# Patient Record
Sex: Female | Born: 1935 | Race: White | Hispanic: No
Health system: Southern US, Community
[De-identification: ages and names within clinical notes are randomized; demographics above are authoritative.]

---

## 2004-03-04 ENCOUNTER — Other Ambulatory Visit: Payer: Self-pay

## 2004-11-08 ENCOUNTER — Ambulatory Visit: Payer: Self-pay | Admitting: Unknown Physician Specialty

## 2005-01-27 ENCOUNTER — Ambulatory Visit: Payer: Self-pay | Admitting: Otolaryngology

## 2005-03-29 ENCOUNTER — Ambulatory Visit: Payer: Self-pay | Admitting: Unknown Physician Specialty

## 2005-05-22 ENCOUNTER — Emergency Department: Payer: Self-pay | Admitting: Emergency Medicine

## 2005-11-09 ENCOUNTER — Ambulatory Visit: Payer: Self-pay | Admitting: Unknown Physician Specialty

## 2005-12-29 ENCOUNTER — Inpatient Hospital Stay: Payer: Self-pay | Admitting: Orthopaedic Surgery

## 2005-12-29 ENCOUNTER — Other Ambulatory Visit: Payer: Self-pay

## 2006-01-03 ENCOUNTER — Encounter: Payer: Self-pay | Admitting: Internal Medicine

## 2006-01-16 ENCOUNTER — Encounter: Payer: Self-pay | Admitting: Internal Medicine

## 2006-02-16 ENCOUNTER — Encounter: Payer: Self-pay | Admitting: Internal Medicine

## 2006-11-13 ENCOUNTER — Ambulatory Visit: Payer: Self-pay | Admitting: Unknown Physician Specialty

## 2007-02-04 ENCOUNTER — Ambulatory Visit: Payer: Self-pay | Admitting: Orthopaedic Surgery

## 2007-02-14 ENCOUNTER — Ambulatory Visit: Payer: Self-pay | Admitting: Orthopaedic Surgery

## 2007-02-14 ENCOUNTER — Other Ambulatory Visit: Payer: Self-pay

## 2007-02-21 ENCOUNTER — Inpatient Hospital Stay: Payer: Self-pay | Admitting: Orthopaedic Surgery

## 2007-12-11 ENCOUNTER — Ambulatory Visit: Payer: Self-pay | Admitting: Unknown Physician Specialty

## 2007-12-20 ENCOUNTER — Ambulatory Visit: Payer: Self-pay | Admitting: Unknown Physician Specialty

## 2009-01-19 ENCOUNTER — Ambulatory Visit: Payer: Self-pay | Admitting: Unknown Physician Specialty

## 2011-05-22 ENCOUNTER — Inpatient Hospital Stay: Payer: Self-pay | Admitting: Internal Medicine

## 2011-10-06 ENCOUNTER — Encounter: Payer: Self-pay | Admitting: Internal Medicine

## 2012-02-13 ENCOUNTER — Inpatient Hospital Stay: Payer: Self-pay | Admitting: Internal Medicine

## 2012-02-13 LAB — CBC WITH DIFFERENTIAL/PLATELET
Eosinophil #: 0 10*3/uL (ref 0.0–0.7)
HCT: 28.4 % — ABNORMAL LOW (ref 35.0–47.0)
HGB: 9.3 g/dL — ABNORMAL LOW (ref 12.0–16.0)
Lymphocyte #: 1 10*3/uL (ref 1.0–3.6)
Lymphocyte %: 12.2 %
MCH: 29.4 pg (ref 26.0–34.0)
MCHC: 32.6 g/dL (ref 32.0–36.0)
Monocyte #: 0.4 x10 3/mm (ref 0.2–0.9)
Neutrophil #: 6.6 10*3/uL — ABNORMAL HIGH (ref 1.4–6.5)
Neutrophil %: 81.8 %
Platelet: 501 10*3/uL — ABNORMAL HIGH (ref 150–440)
RDW: 13.3 % (ref 11.5–14.5)
WBC: 8.1 10*3/uL (ref 3.6–11.0)

## 2012-02-13 LAB — COMPREHENSIVE METABOLIC PANEL
Bilirubin,Total: 0.6 mg/dL (ref 0.2–1.0)
Calcium, Total: 9.1 mg/dL (ref 8.5–10.1)
Chloride: 100 mmol/L (ref 98–107)
Co2: 28 mmol/L (ref 21–32)
Creatinine: 1.71 mg/dL — ABNORMAL HIGH (ref 0.60–1.30)
Glucose: 309 mg/dL — ABNORMAL HIGH (ref 65–99)
SGPT (ALT): 29 U/L
Total Protein: 6.8 g/dL (ref 6.4–8.2)

## 2012-02-13 LAB — PROTIME-INR
INR: 1
Prothrombin Time: 13.3 secs (ref 11.5–14.7)

## 2012-02-13 LAB — URINALYSIS, COMPLETE
Glucose,UR: 150 mg/dL (ref 0–75)
Ketone: NEGATIVE
Nitrite: NEGATIVE
Ph: 5 (ref 4.5–8.0)
Protein: 100
Specific Gravity: 1.024 (ref 1.003–1.030)
Squamous Epithelial: NONE SEEN

## 2012-02-13 LAB — APTT: Activated PTT: 33.7 secs (ref 23.6–35.9)

## 2012-02-14 LAB — CBC WITH DIFFERENTIAL/PLATELET
Basophil #: 0 10*3/uL (ref 0.0–0.1)
Basophil %: 0.2 %
Eosinophil #: 0.1 10*3/uL (ref 0.0–0.7)
Eosinophil %: 1.6 %
HCT: 25.4 % — ABNORMAL LOW (ref 35.0–47.0)
Lymphocyte #: 0.9 10*3/uL — ABNORMAL LOW (ref 1.0–3.6)
Lymphocyte %: 12.1 %
MCH: 29.7 pg (ref 26.0–34.0)
MCHC: 33.5 g/dL (ref 32.0–36.0)
MCV: 89 fL (ref 80–100)
Monocyte #: 0.5 x10 3/mm (ref 0.2–0.9)
Neutrophil #: 5.7 10*3/uL (ref 1.4–6.5)
Neutrophil %: 79.4 %
Platelet: 477 10*3/uL — ABNORMAL HIGH (ref 150–440)
RDW: 12.8 % (ref 11.5–14.5)

## 2012-02-14 LAB — COMPREHENSIVE METABOLIC PANEL
Alkaline Phosphatase: 72 U/L (ref 50–136)
Anion Gap: 9 (ref 7–16)
Bilirubin,Total: 0.5 mg/dL (ref 0.2–1.0)
Co2: 26 mmol/L (ref 21–32)
EGFR (African American): 40 — ABNORMAL LOW
EGFR (Non-African Amer.): 34 — ABNORMAL LOW
Glucose: 111 mg/dL — ABNORMAL HIGH (ref 65–99)
Osmolality: 284 (ref 275–301)
Potassium: 4.1 mmol/L (ref 3.5–5.1)
SGOT(AST): 20 U/L (ref 15–37)
Total Protein: 5.7 g/dL — ABNORMAL LOW (ref 6.4–8.2)

## 2012-02-15 LAB — BASIC METABOLIC PANEL
BUN: 21 mg/dL — ABNORMAL HIGH (ref 7–18)
Calcium, Total: 7.8 mg/dL — ABNORMAL LOW (ref 8.5–10.1)
Chloride: 111 mmol/L — ABNORMAL HIGH (ref 98–107)
Co2: 23 mmol/L (ref 21–32)
EGFR (Non-African Amer.): 40 — ABNORMAL LOW
Glucose: 106 mg/dL — ABNORMAL HIGH (ref 65–99)
Osmolality: 288 (ref 275–301)
Sodium: 143 mmol/L (ref 136–145)

## 2012-02-15 LAB — CBC WITH DIFFERENTIAL/PLATELET
Eosinophil #: 0 10*3/uL (ref 0.0–0.7)
Eosinophil %: 0 %
HCT: 24 % — ABNORMAL LOW (ref 35.0–47.0)
HGB: 8 g/dL — ABNORMAL LOW (ref 12.0–16.0)
Lymphocyte #: 0.6 10*3/uL — ABNORMAL LOW (ref 1.0–3.6)
MCH: 29.6 pg (ref 26.0–34.0)
MCHC: 33.3 g/dL (ref 32.0–36.0)
MCV: 89 fL (ref 80–100)
Neutrophil #: 4.7 10*3/uL (ref 1.4–6.5)
Platelet: 470 10*3/uL — ABNORMAL HIGH (ref 150–440)
RBC: 2.7 10*6/uL — ABNORMAL LOW (ref 3.80–5.20)
RDW: 13.1 % (ref 11.5–14.5)

## 2012-02-16 LAB — CBC WITH DIFFERENTIAL/PLATELET
Basophil #: 0 10*3/uL (ref 0.0–0.1)
Basophil %: 0.1 %
Basophil %: 0.1 %
Eosinophil %: 0 %
HCT: 20.8 % — ABNORMAL LOW (ref 35.0–47.0)
Lymphocyte #: 0.8 10*3/uL — ABNORMAL LOW (ref 1.0–3.6)
MCH: 29 pg (ref 26.0–34.0)
MCHC: 32.4 g/dL (ref 32.0–36.0)
MCHC: 32.4 g/dL (ref 32.0–36.0)
MCV: 89 fL (ref 80–100)
Monocyte #: 0.8 x10 3/mm (ref 0.2–0.9)
Monocyte %: 7.9 %
Monocyte %: 6.9 %
Neutrophil #: 8 10*3/uL — ABNORMAL HIGH (ref 1.4–6.5)
Neutrophil #: 8.4 10*3/uL — ABNORMAL HIGH (ref 1.4–6.5)
Platelet: 511 10*3/uL — ABNORMAL HIGH (ref 150–440)
Platelet: 479 10*3/uL — ABNORMAL HIGH (ref 150–440)
RBC: 2.35 10*6/uL — ABNORMAL LOW (ref 3.80–5.20)
RDW: 13.4 % (ref 11.5–14.5)
WBC: 9.6 10*3/uL (ref 3.6–11.0)
WBC: 10.4 10*3/uL (ref 3.6–11.0)

## 2012-02-17 LAB — CBC WITH DIFFERENTIAL/PLATELET
Basophil #: 0 10*3/uL (ref 0.0–0.1)
Basophil %: 0.1 %
Eosinophil %: 0.1 %
HCT: 33.8 % — ABNORMAL LOW (ref 35.0–47.0)
Lymphocyte #: 1 10*3/uL (ref 1.0–3.6)
MCH: 29.5 pg (ref 26.0–34.0)
MCHC: 32.8 g/dL (ref 32.0–36.0)
MCV: 90 fL (ref 80–100)
Neutrophil #: 7.7 10*3/uL — ABNORMAL HIGH (ref 1.4–6.5)
WBC: 9.3 10*3/uL (ref 3.6–11.0)

## 2012-02-17 LAB — BASIC METABOLIC PANEL
Anion Gap: 10 (ref 7–16)
Creatinine: 1.51 mg/dL — ABNORMAL HIGH (ref 0.60–1.30)
EGFR (African American): 38 — ABNORMAL LOW
EGFR (Non-African Amer.): 33 — ABNORMAL LOW
Glucose: 141 mg/dL — ABNORMAL HIGH (ref 65–99)
Osmolality: 301 (ref 275–301)
Potassium: 3.9 mmol/L (ref 3.5–5.1)
Sodium: 147 mmol/L — ABNORMAL HIGH (ref 136–145)

## 2012-02-18 LAB — BASIC METABOLIC PANEL
Calcium, Total: 8 mg/dL — ABNORMAL LOW (ref 8.5–10.1)
Co2: 24 mmol/L (ref 21–32)
EGFR (African American): 44 — ABNORMAL LOW
EGFR (Non-African Amer.): 38 — ABNORMAL LOW
Glucose: 76 mg/dL (ref 65–99)
Osmolality: 301 (ref 275–301)
Sodium: 148 mmol/L — ABNORMAL HIGH (ref 136–145)

## 2012-02-29 ENCOUNTER — Inpatient Hospital Stay: Payer: Self-pay | Admitting: Internal Medicine

## 2012-02-29 LAB — BASIC METABOLIC PANEL
Anion Gap: 8 (ref 7–16)
BUN: 26 mg/dL — ABNORMAL HIGH (ref 7–18)
Calcium, Total: 8.6 mg/dL (ref 8.5–10.1)
Chloride: 104 mmol/L (ref 98–107)
Creatinine: 1.62 mg/dL — ABNORMAL HIGH (ref 0.60–1.30)
EGFR (African American): 35 — ABNORMAL LOW
EGFR (Non-African Amer.): 30 — ABNORMAL LOW
Glucose: 57 mg/dL — ABNORMAL LOW (ref 65–99)
Osmolality: 278 (ref 275–301)
Potassium: 4.4 mmol/L (ref 3.5–5.1)
Sodium: 138 mmol/L (ref 136–145)

## 2012-02-29 LAB — URINALYSIS, COMPLETE
Bacteria: NONE SEEN
Bilirubin,UR: NEGATIVE
Blood: NEGATIVE
Glucose,UR: NEGATIVE mg/dL (ref 0–75)
Ketone: NEGATIVE
WBC UR: 56 /HPF (ref 0–5)

## 2012-02-29 LAB — CBC
HCT: 36.2 % (ref 35.0–47.0)
HGB: 11.7 g/dL — ABNORMAL LOW (ref 12.0–16.0)
MCH: 29.5 pg (ref 26.0–34.0)
MCHC: 32.3 g/dL (ref 32.0–36.0)
MCV: 91 fL (ref 80–100)
Platelet: 382 10*3/uL (ref 150–440)
RBC: 3.96 10*6/uL (ref 3.80–5.20)

## 2012-03-01 LAB — CBC WITH DIFFERENTIAL/PLATELET
Basophil %: 0.2 %
Eosinophil #: 0.2 10*3/uL (ref 0.0–0.7)
Eosinophil %: 1.5 %
HCT: 32.4 % — ABNORMAL LOW (ref 35.0–47.0)
HGB: 10.1 g/dL — ABNORMAL LOW (ref 12.0–16.0)
MCH: 28.5 pg (ref 26.0–34.0)
MCHC: 31.3 g/dL — ABNORMAL LOW (ref 32.0–36.0)
MCV: 91 fL (ref 80–100)
Monocyte #: 0.6 x10 3/mm (ref 0.2–0.9)
Monocyte %: 4.4 %
Neutrophil #: 12.6 10*3/uL — ABNORMAL HIGH (ref 1.4–6.5)
Neutrophil %: 90.1 %
RBC: 3.56 10*6/uL — ABNORMAL LOW (ref 3.80–5.20)
RDW: 15.2 % — ABNORMAL HIGH (ref 11.5–14.5)

## 2012-03-01 LAB — BASIC METABOLIC PANEL
Anion Gap: 9 (ref 7–16)
Calcium, Total: 8 mg/dL — ABNORMAL LOW (ref 8.5–10.1)
Chloride: 105 mmol/L (ref 98–107)
Co2: 24 mmol/L (ref 21–32)
Creatinine: 1.65 mg/dL — ABNORMAL HIGH (ref 0.60–1.30)
EGFR (African American): 34 — ABNORMAL LOW
Glucose: 116 mg/dL — ABNORMAL HIGH (ref 65–99)
Osmolality: 281 (ref 275–301)

## 2012-03-02 LAB — CBC WITH DIFFERENTIAL/PLATELET
Basophil #: 0.1 10*3/uL (ref 0.0–0.1)
Eosinophil #: 0.5 10*3/uL (ref 0.0–0.7)
Eosinophil %: 6.4 %
Lymphocyte #: 1.1 10*3/uL (ref 1.0–3.6)
MCH: 29.4 pg (ref 26.0–34.0)
MCHC: 32.7 g/dL (ref 32.0–36.0)
MCV: 90 fL (ref 80–100)
Monocyte #: 0.7 x10 3/mm (ref 0.2–0.9)
Monocyte %: 9.3 %
Platelet: 299 10*3/uL (ref 150–440)
RBC: 3.47 10*6/uL — ABNORMAL LOW (ref 3.80–5.20)
RDW: 14.9 % — ABNORMAL HIGH (ref 11.5–14.5)
WBC: 7.2 10*3/uL (ref 3.6–11.0)

## 2012-03-02 LAB — BASIC METABOLIC PANEL
Anion Gap: 12 (ref 7–16)
BUN: 25 mg/dL — ABNORMAL HIGH (ref 7–18)
Calcium, Total: 7.7 mg/dL — ABNORMAL LOW (ref 8.5–10.1)
Chloride: 108 mmol/L — ABNORMAL HIGH (ref 98–107)
Co2: 18 mmol/L — ABNORMAL LOW (ref 21–32)
EGFR (Non-African Amer.): 34 — ABNORMAL LOW
Glucose: 150 mg/dL — ABNORMAL HIGH (ref 65–99)
Osmolality: 283 (ref 275–301)
Potassium: 4 mmol/L (ref 3.5–5.1)

## 2012-03-03 LAB — URINE CULTURE

## 2012-03-03 LAB — VANCOMYCIN, TROUGH: Vancomycin, Trough: 14 ug/mL (ref 10–20)

## 2012-03-04 LAB — CULTURE, BLOOD (SINGLE)

## 2012-03-06 LAB — CULTURE, BLOOD (SINGLE)

## 2012-07-19 DEATH — deceased

## 2013-07-12 IMAGING — CT CT ABD-PELV W/O CM
1 of 3 series · 12 of 32 positions shown, 18 images · non-contrast
Comparison: none

REASON FOR EXAM: (1) abd pain, please evaluate the lumbosacral area if
possible; (2) pelvic tende
COMMENTS:

PROCEDURE:     CT  - CT ABDOMEN AND PELVIS W[DATE]  [DATE]
RESULT:
TECHNIQUE: Helical noncontrasted 3 mm sections were obtained from the lung
bases through the pubic symphysis.

[Series 2: 3mm soft tissue · axial · 0.66mm/px · z∈[-631,-268]mm · 12 of 144 slices shown, 18 images]
[im 12/144  soft-tissue]
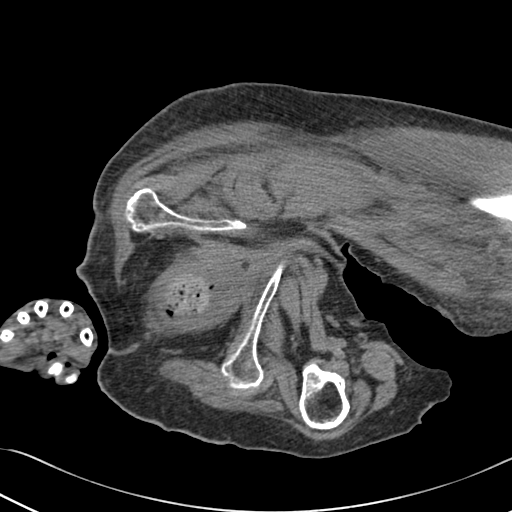
[im 12/144  bone]
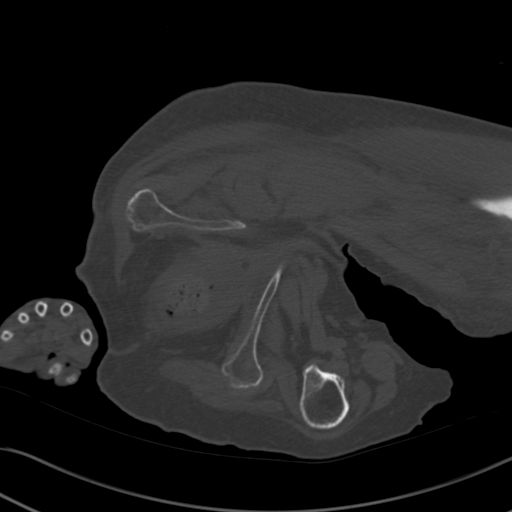
[im 23/144  soft-tissue]
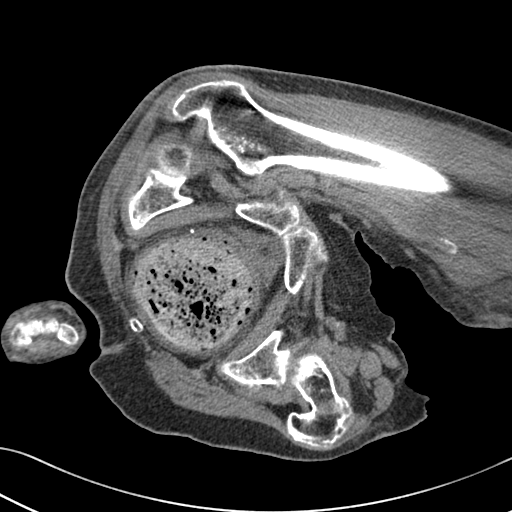
[im 34/144  soft-tissue]
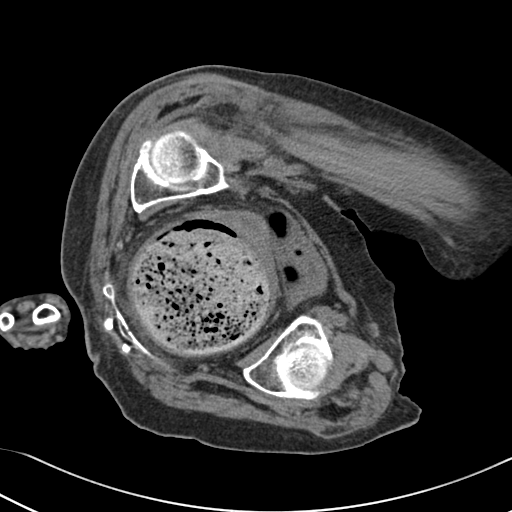
[im 45/144  soft-tissue]
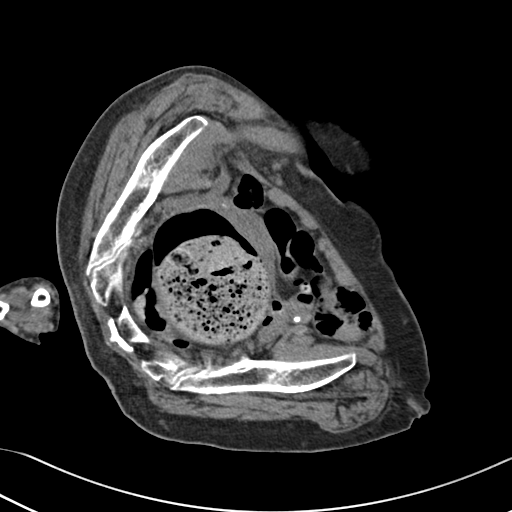
[im 56/144  soft-tissue]
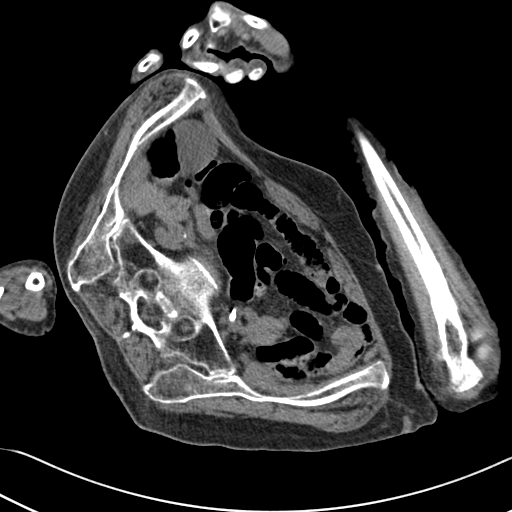
[im 67/144  soft-tissue]
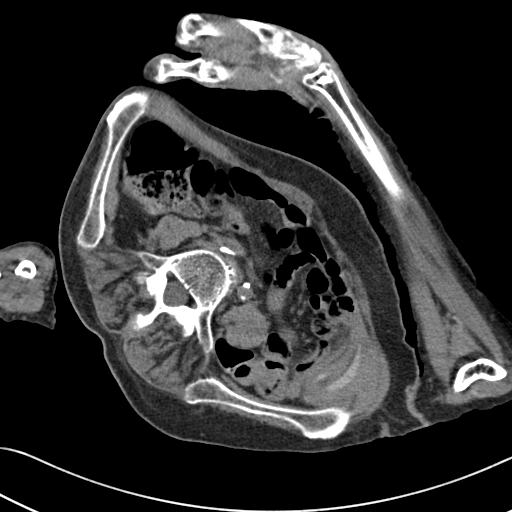
[im 78/144  soft-tissue]
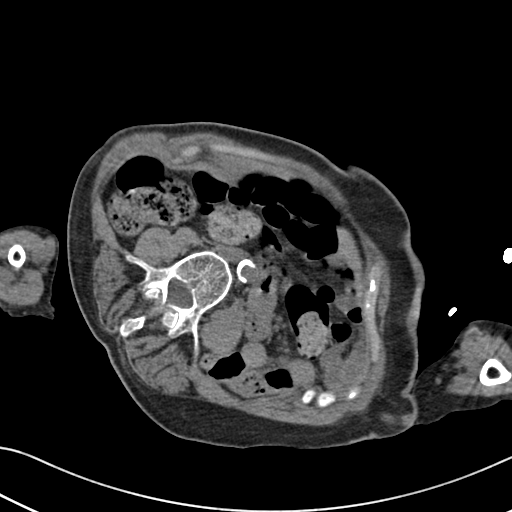
[im 89/144  soft-tissue]
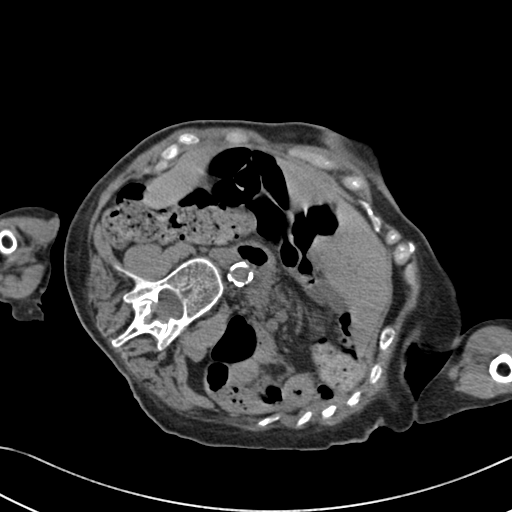
[im 100/144  soft-tissue]
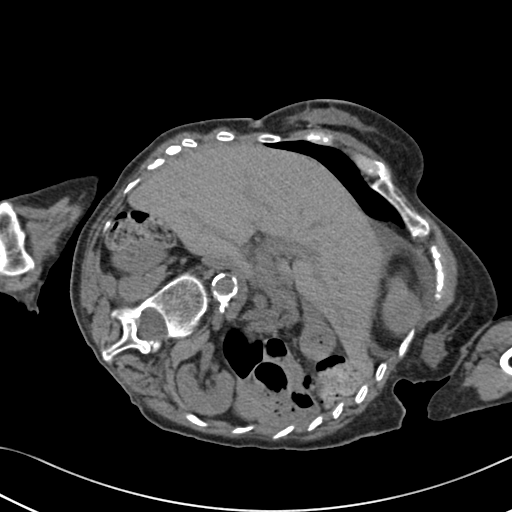
[im 100/144  lung]
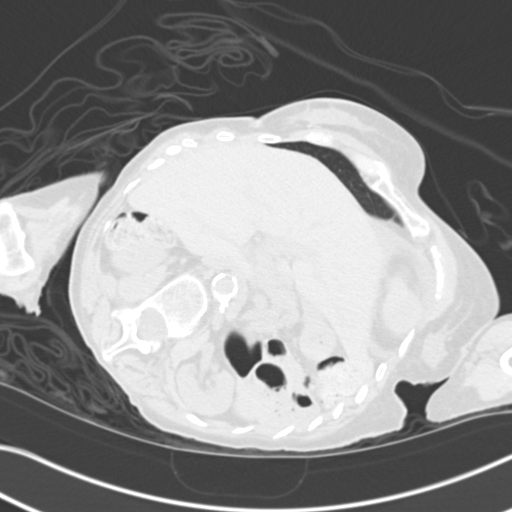
[im 100/144  bone]
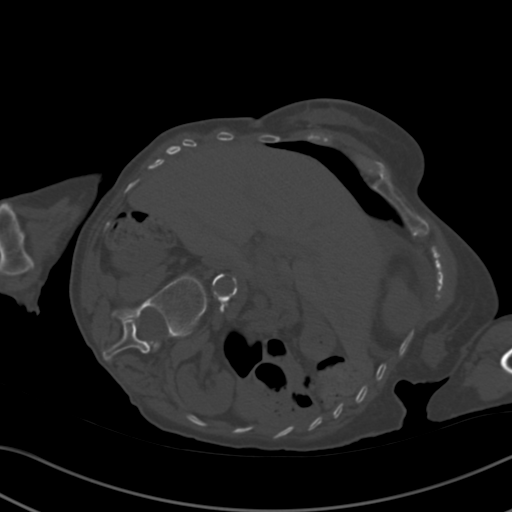
[im 111/144  soft-tissue]
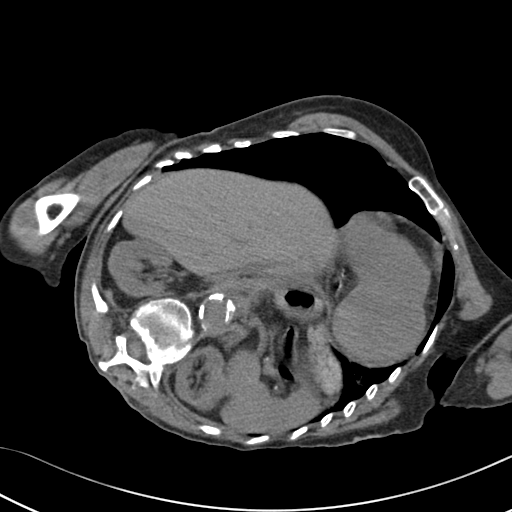
[im 111/144  lung]
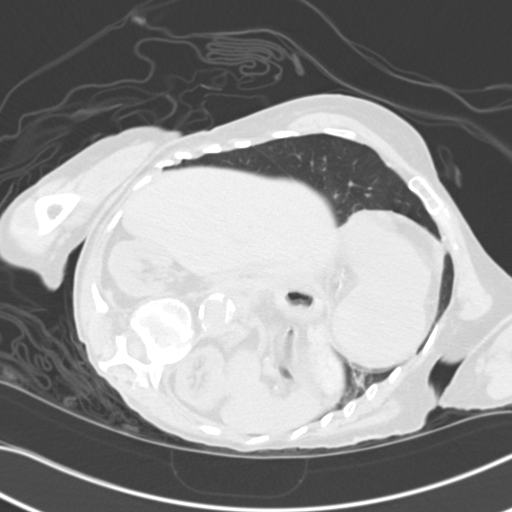
[im 122/144  soft-tissue]
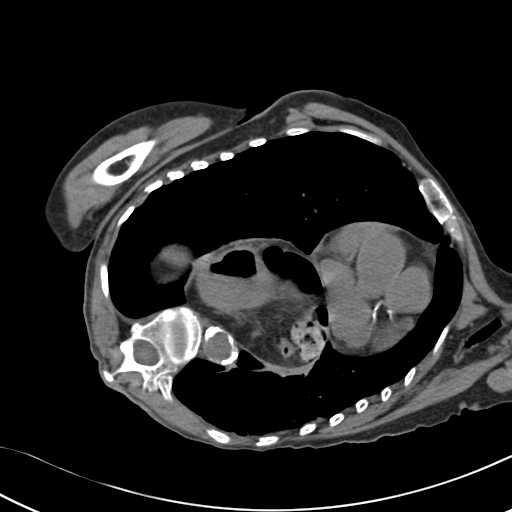
[im 122/144  lung]
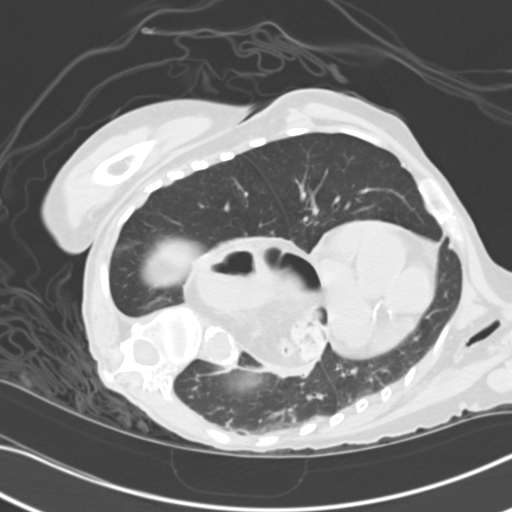
[im 133/144  soft-tissue]
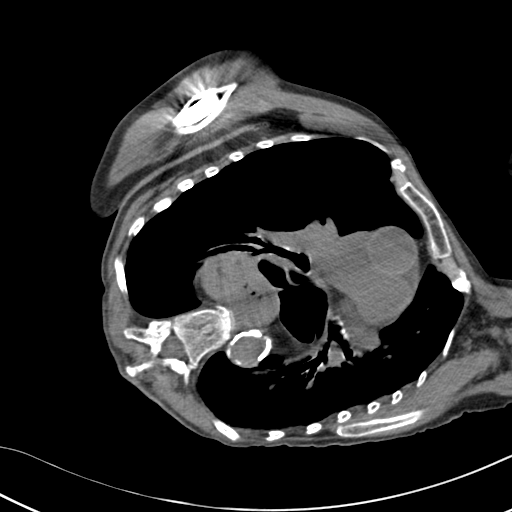
[im 133/144  lung]
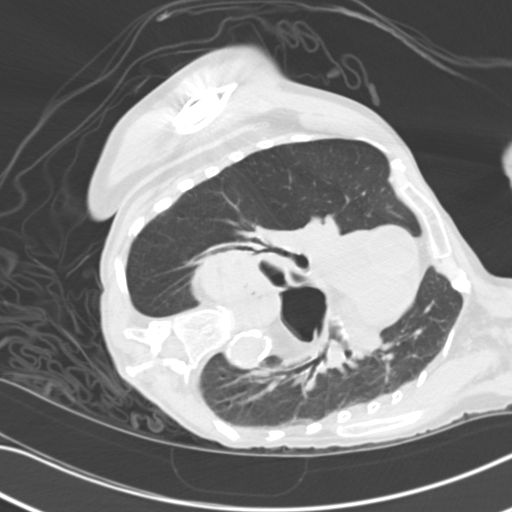

[12 of 32 positions shown; findings below may reference images not displayed]

FINDINGS: A large hiatal hernia is identified within the posterior
mediastinum. The lung parenchyma demonstrates interstitial and emphysematous
changes within the lung bases.

Noncontrasted evaluation of the liver, spleen, adrenals, pancreas, and
kidneys is unremarkable. There is no evidence of an abdominal aortic
aneurysm. There is no evidence of bowel obstruction, enteritis, colitis, nor
diverticulitis. Multiple air-filled loops of large and small bowel are
identified. A large amount of stool is identified within the rectosigmoid
colon region.

Evaluation of the osseous structures with bone windowing demonstrates no
evidence of fracture nor dislocation. There is no evidence of cortical
irregularity or destruction. The bones are osteopenic.
IMPRESSION: 1. Large amount of stool within the rectosigmoid colon region which may
represent the sequela of fecal impaction.
2. Large hiatal hernia.
3. No evidence of obstructive or inflammatory abnormalities within the
limitations of a noncontrasted CT.

## 2013-07-12 IMAGING — CR RIGHT FOOT COMPLETE - 3+ VIEW
1 series · 4 of 4 positions shown · non-contrast
Comparison: none

REASON FOR EXAM: foot painful
COMMENTS:

PROCEDURE:     DXR - DXR FOOT RT COMPLETE W/OBLIQUES  - February 13, 2012  [DATE]
RESULT:     No acute fracture or dislocation is seen. No lytic or blastic
lesions are identified. Incidental note is made of a plantar calcaneal spur.

[Series 1: x foot lat right · 0.14mm/px · 4 of 4 slices shown]
[im 1/4]
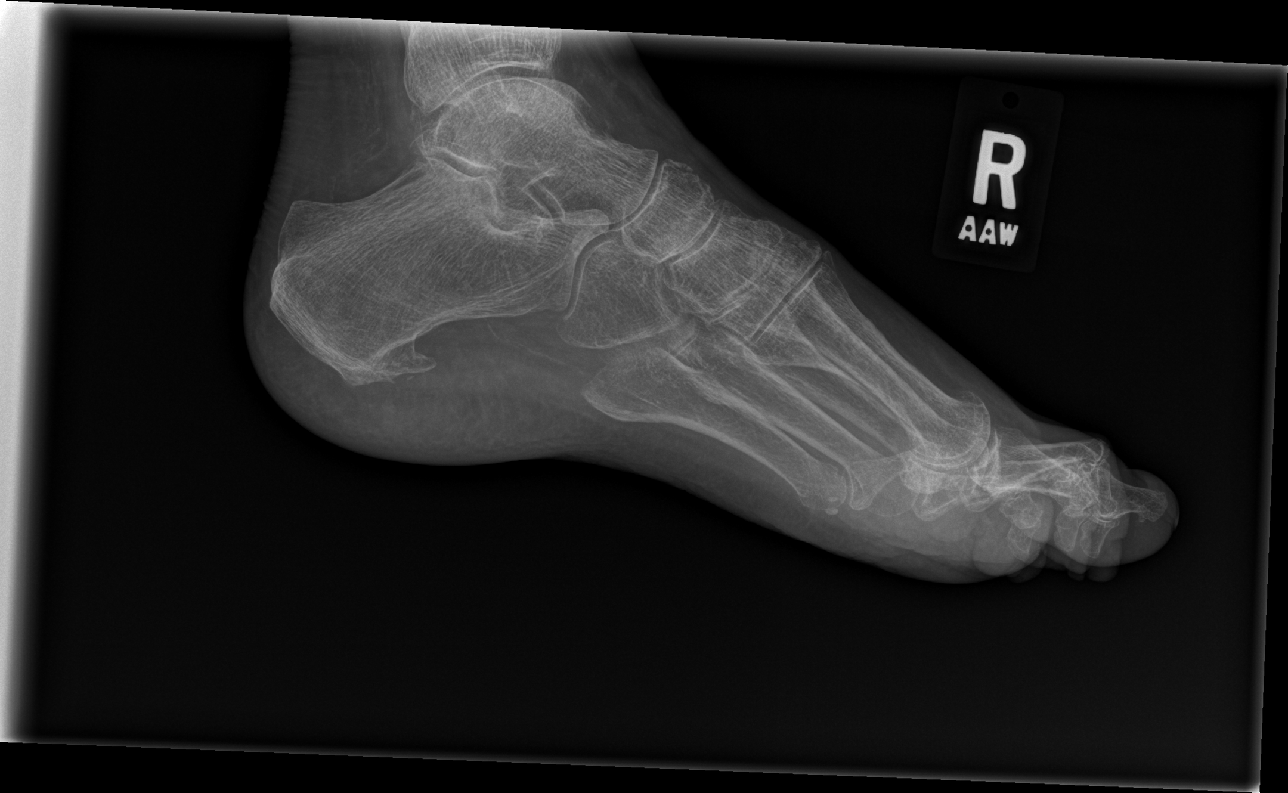
[im 2/4]
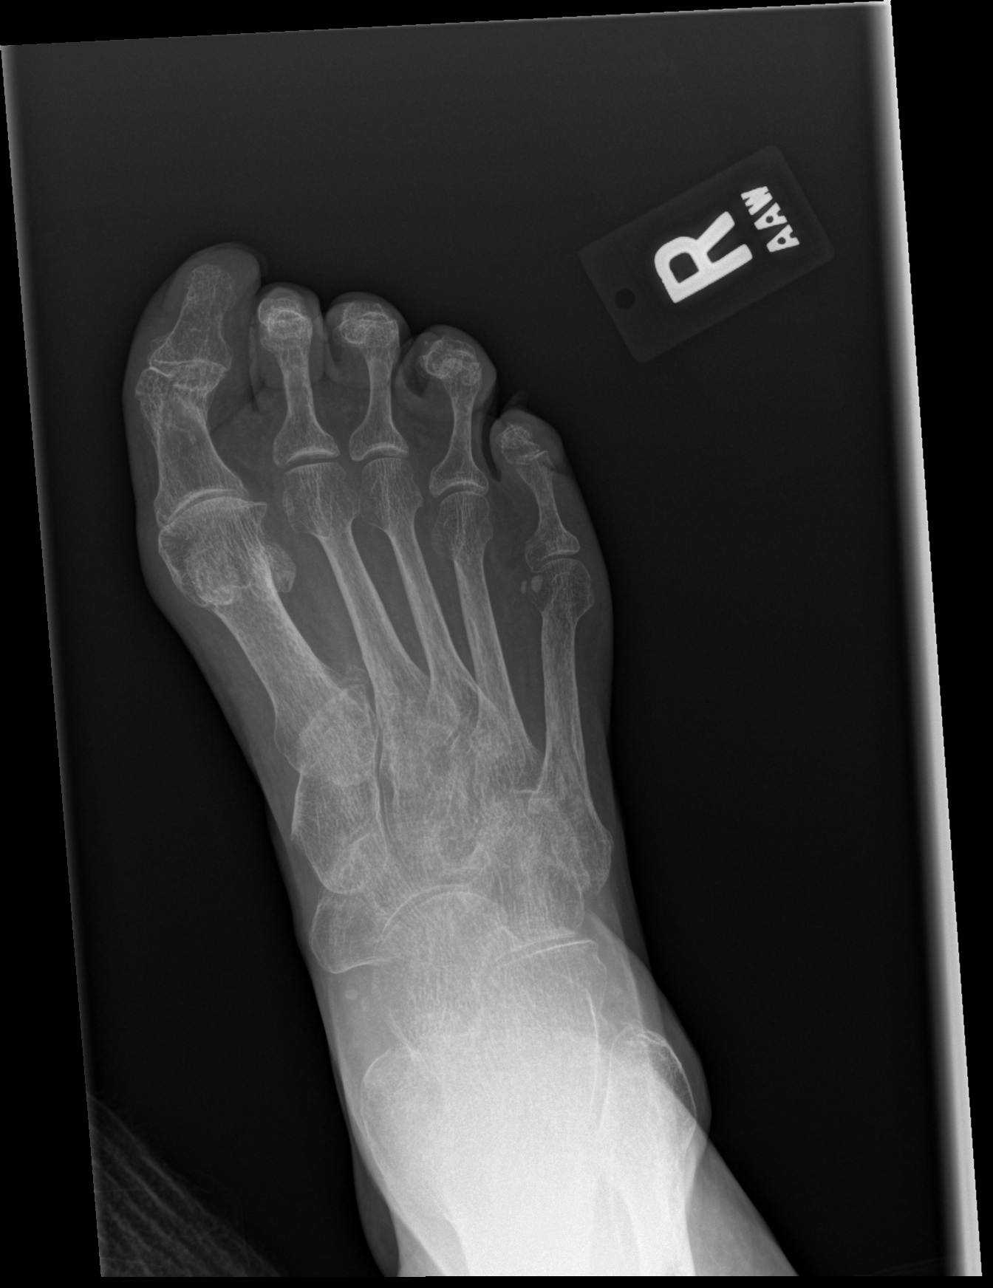
[im 3/4]
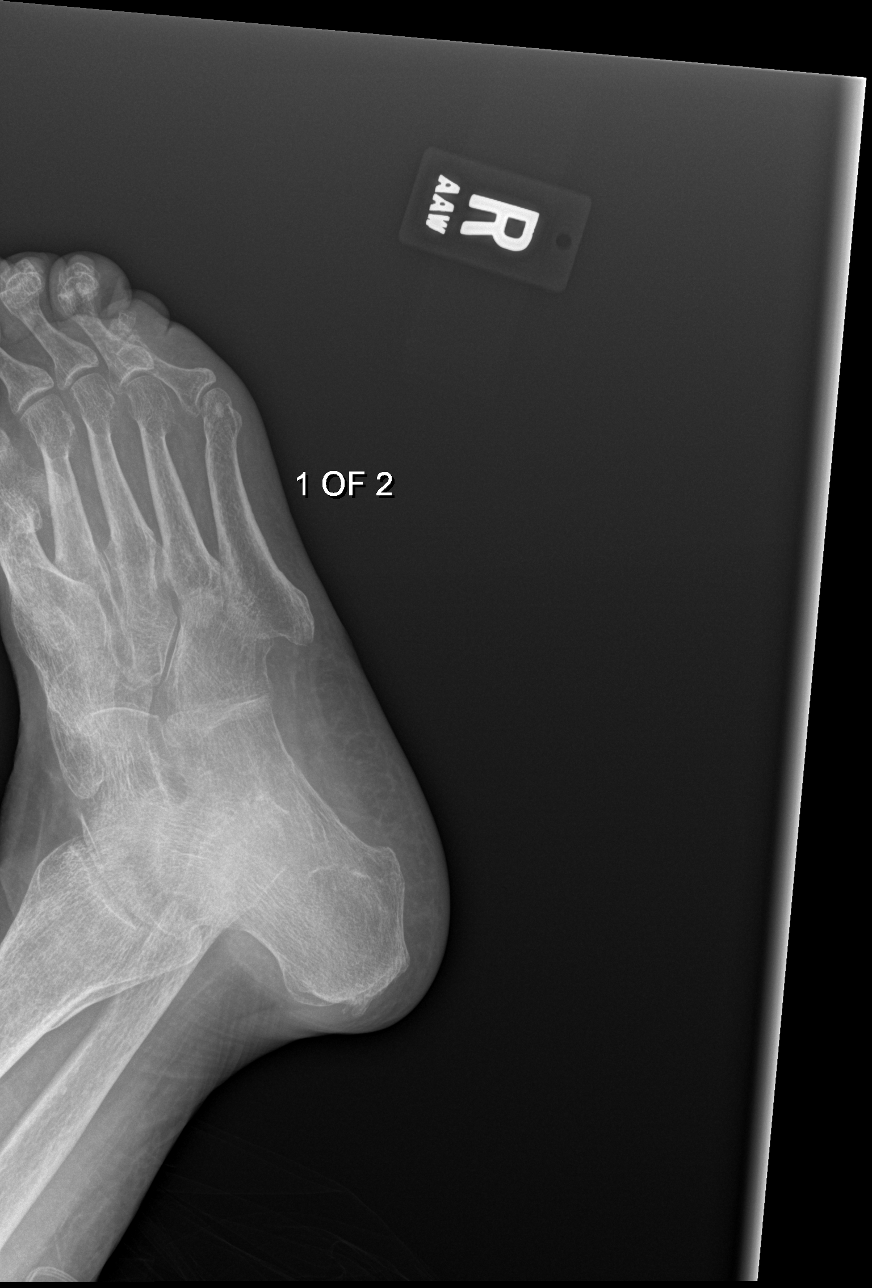
[im 4/4]
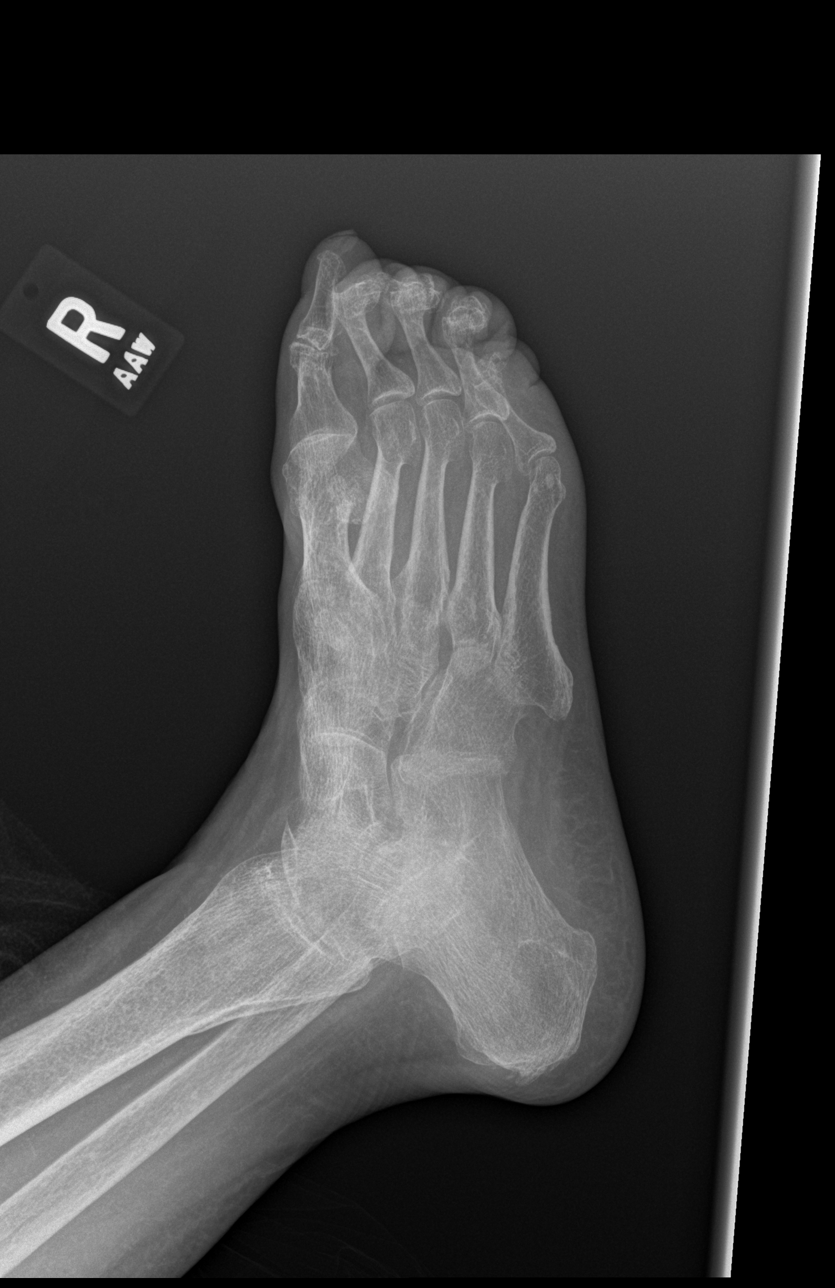

[4 of 4 positions shown; findings below may reference images not displayed]

IMPRESSION: 1.  No acute changes are identified.
2.  A plantar calcaneal spur is noted.

[REDACTED]

## 2015-01-10 NOTE — Discharge Summary (Signed)
PATIENT NAME:  Brenda Frey, Brenda Frey MR#:  [id number] DATE OF BIRTH:  21-Mar-1931  DATE OF ADMISSION:  02/29/2012 DATE OF DISCHARGE:  03/04/2012  DISCHARGE DIAGNOSES:  1. Bilateral periorbital cellulitis.  2. History of methicillin-resistant Staphylococcus aureus. 3. History of chronic urinary tract infection with indwelling Foley.  4. Severe dementia, wheelchair bound with contractures and nonverbal.  5. Chronic kidney disease, stage 2.  6. Hypertension.  7. Hyperlipidemia.  8. Enterococcus faecalis bacteremia.  9. Gastroesophageal reflux disease. 10. Hiatal hernia.   DISPOSITION: The patient is being discharged home.   FOLLOWUP:  1. Follow-up with primary care physician and Dr. Leavy CellaBlocker in 1 to 2 weeks after discharge.  2. Resume Hospice.   DIET: Low sodium, ADA diet, dysphagia I pureed with nectar-thick liquids.   DISCHARGE MEDICATIONS:  1. Lopressor 50 mg b.i.d.  2. NovoLog insulin sliding scale.  3. Amoxicillin 500 mg t.i.d. for 10 days.  4. Flexeril 10 mg every 8 hours p.r.n. for muscle spasms.  5. Omeprazole 20 mg b.i.d.  6. Loratadine 10 mg daily.  7. Simvastatin 10 mg daily.  8. Aricept 10 mg daily.  9. Lantus 8 units subcutaneously once a day.  10. Detrol LA 1 capsule once a day.  11. Hydralazine 25 mg q.i.d.  CONSULTATIONS: ID consultation with Dr. Leavy CellaBlocker.    LABORATORY, DIAGNOSTIC AND RADIOLOGICAL DATA:  Blood cultures: One out of four bottles of blood cultures grew Enterococcus faecalis which was pansensitive.  CT maxillofacial: Findings which may reflect sequela of bilateral periorbital cellulitis, right greater than left. No evidence of postseptal extension. Findings may represent sinus disease.  Urine culture grew fungus, likely colonization.  White count  normal. Hemoglobin 11.7 to 10.2. Normal platelet count. Creatinine ranging from 1.62 to 1.46. The rest of BMP normal.   HOSPITAL COURSE: The patient is a 81 year old female with a history of diabetes, hypertension,  severe dementia, who is bedbound/wheelchair bound with contractures and nonverbal, presented with facial swelling. She was admitted with a diagnosis of bilateral periorbital cellulitis. A CT scan of the maxillofacial area confirmed that. There was no extension into the septal or preseptal area. The patient had a history of MRSA and was started on IV vancomycin. An ID consultation with Dr. Leavy CellaBlocker was obtained, who also felt that the patient possibly had an allergic reaction/rash to Bactrim which the patient was taking prior to admission. The patient has a chronic Foley catheter and has recurrent UTIs. The Foley catheter was changed during the hospitalization on 03/01/2012. Urine culture showed funguria which was felt to be colonization. The patient's blood pressure was elevated during the hospitalization, and changes were made to her medications to achieve good hypertensive control. One out of four bottles of blood culture grew Enterococcus faecalis. As per Dr. Leavy CellaBlocker, the patient did not require TEE.  Since the enterococcus was pansensitive, and he recommended discharging the patient home on oral amoxicillin. The patient remained afebrile during the hospitalization with no other active issues. She is being discharged home with Hospice in a stable condition.   TIME SPENT: 45 minutes.  ____________________________ Darrick MeigsSangeeta Brendalyn Vallely, MD sp:cbb D: 03/04/2012 15:35:31 ET T: 03/04/2012 17:00:28 ET JOB#: [id number]  cc: Darrick MeigsSangeeta Gradie Butrick, MD, <Dictator> Darrick MeigsSANGEETA Trinidad Petron MD ELECTRONICALLY SIGNED 03/05/2012 16:24

## 2015-01-10 NOTE — Consult Note (Signed)
PATIENT NAME:  Brenda, Frey MR#:  [id number] DATE OF BIRTH:  01/22/1931  DATE OF CONSULTATION:  02/13/2012  REFERRING PHYSICIAN:  Prime Doc  CONSULTING PHYSICIAN:  Annice Needy, MD  REASON FOR CONSULTATION: Evaluate for right lower extremity ischemia.   HISTORY OF PRESENT ILLNESS: This is a 81 year old white female who has advanced dementia, is nonverbal and in hospice who was admitted to the hospital. She has severe flexion contracture of the right leg as well as the left leg, but apparently it was felt that her right foot was painful to her. We are asked to evaluate this for arterial insufficiency. She can provide none of the history and this is obtained from the previous medical record and examination is limited due to her overall frail condition, extensive flexion contractures, and current somewhat irritated state.   PAST MEDICAL HISTORY:  1. Advanced dementia, on hospice care.  2. Hypertension.  3. Hyperlipidemia.  4. Diabetes.   ALLERGIES: No known drug allergies.   SOCIAL HISTORY: She is in hospice services due to her end-stage dementia, living at home.  FAMILY HISTORY: Positive for hypertension.  HOME MEDICATIONS:  (Per the History and Physical) 1. Actos 45 mg daily.  2. Aricept 10 mg at night.  3. Aspirin 81 mg daily.  4. Avapro 300 mg daily.  5. Celebrex 200 mg daily.  6. Citrucel daily.  7. Detrol LA 4 mg daily. 8. Glipizide 10 mg daily.  9. Metformin 1000 mg daily.  10. Multivitamin daily.  11. Namenda 10 mg twice a day.  12. Omeprazole 20 mg daily.  13. Simvastatin 10 mg at bedtime.  14. Vitamin B12 monthly.  15. Zyrtec 10 mg daily.  REVIEW OF SYSTEMS: Unobtainable due to her dementia.   PHYSICAL EXAMINATION:   GENERAL: This is a frail, elderly white female with severe flexion contractures.   VITAL SIGNS: Temperature 98.3, heart rate 60, blood pressure 150/67, and saturation 98% on room air.   EYES: Pupils are equal, round and reactive to light. Extraocular  movements are intact.  HEAD: Normocephalic, atraumatic.   NECK: Supple without adenopathy or jugular venous distention.   HEART: Regular. No murmurs, rubs, or gallops.   LUNGS: Clear bilaterally without increased respiratory effort.   ABDOMEN: Soft, nondistended, and nontender.   EXTREMITIES: She has a flexion contracture, worse on the right than the left. She is unable to extend it beyond 90 degrees. Her left leg does not have the severe contracture. On examination her right foot is warm. There is minimal discoloration laterally. She has good capillary refill and no evidence clinically of ischemia. Her pedal pulses are not easily palpable, but are Dopplerable. Likely this more of a chronic issue as they are similar in both feet.   NEURO: She is nonverbal, unable to assess strength or tone.   SKIN: No ulcerations detected.  LABORATORY DATA: White blood cell count 8.1, hemoglobin 9.3, and platelet count 501,000. Sodium 136, potassium 4.6, chloride 100, CO2 28, BUN 31, creatinine 1.71, and glucose 309.   ASSESSMENT AND PLAN: This is a 80 year old white female with what was reported as right foot pain. It is very difficult to reproduce this on exam and I am not sure how they have decided this foot is painful to her. She does not have any evidence of acute ischemia or limb threat. She likely has some chronic peripheral vascular disease. It is really not an issue in a patient with end-stage dementia and on hospice care. No acute intervention would be  warranted and I would not start her on anticoagulants for this. I have no other recommendations.   This is a level-4 consultation.  ____________________________ Annice NeedyJason S. Damarcus Reggio, MD jsd:slb D: 02/28/2012 12:52:33 ET T: 02/28/2012 14:32:47 ET JOB#: [id number]  cc: Annice NeedyJason S. Lemya Greenwell, MD, <Dictator> Annice NeedyJASON S Lindalee Huizinga MD ELECTRONICALLY SIGNED 03/07/2012 9:04

## 2015-01-10 NOTE — Consult Note (Signed)
Impression: 81yo WF w/ h/o dementia, DM, CRI, chronic foley catheterization, recent Methacillin Resistant Staph aureus in the urine admitted with probable sulfa allergic reaction.  She started TMP/SMX a few days prior to the facial swelling and erythema.  Around this time she began having early morning hypoglycemia.  These symptoms have improved since admission and stopping of the TMP/SMX.  Would consider her allergic to sulfa. She was having fevers prior to starting the TMP/SMX.  She was admitted recently for leg swelling.  At that time, a urine culture grew Methacillin Resistant Staph aureus.  While Staph aureus can cause UTIs in catheterized patients, it is also associated with hematogenous spread to the kidneys.  No blood cultures were obtained during her last hospitalization.  She could have endocarditis with Staph aureus.  Will await the current blood cultures.  Would not pursue echo unless her BCx are positive for Methacillin Resistant Staph aureus.  On the other hand, many patients with chronic foleys have chronic colonization.  The presence of pyuria and a positive culture in a demented patient may not indicate any infection at all.  It may be that the Staph in the urine represents chronic colonization. Will continue the vanco for now.   Follow her fever curve and WBC. Maintain contact isolation.  Electronic Signatures: Jarmel Linhardt, Rosalyn GessMichael E (MD)  (Signed on 14-Jun-13 15:37)  Authored  Last Updated: 14-Jun-13 15:37 by Shalunda Lindh, Rosalyn GessMichael E (MD)

## 2015-01-10 NOTE — Consult Note (Signed)
Asked to see patient regarding potential ischemic/embolic phenomenon to the RLE.  She has a severe flexion contracture of that leg.  She has advanced dementia and is non-verbal and in hospice, but this is apparently painful fo her.  The foot is warm, there is minimal discoloration laterally.  She has good capillary refill and no evidence clinically of ischemia.  Suspect some sort of musculoskeletal or neurologic issue is more problematic, and acute arterial insufficiency does not appear to be present.  Would not start anti-coagulants.  No other recs.  Will sign off, please call with questions.    Electronic Signatures: Annice Needyew, Jason S (MD)  (Signed on 28-May-13 20:33)  Authored  Last Updated: 28-May-13 20:33 by Annice Needyew, Jason S (MD)

## 2015-01-10 NOTE — Discharge Summary (Signed)
PATIENT NAME:  Brenda Frey, Brenda Frey MR#:  [id number] DATE OF BIRTH:  Aug 28, 1931  DATE OF ADMISSION:  02/13/2012 DATE OF DISCHARGE:  02/19/2012  DISCHARGE DIAGNOSES: 1. Right leg contracture likely secondary to underlying dementia.  2. Staphylococcal urinary tract infection. 3. Hypertension. 4. Diabetes mellitus type 2.  5. Chronic kidney disease stage II. 6. Dementia. 7. Gastroesophageal reflux disease. 8. Hiatal hernia.  9. Acute on chronic anemia.   DISCHARGE MEDICATIONS: 1. Simvastatin 10 mg p.o. daily. 2. Aricept 10 mg p.o. daily. 3. Aspirin 81 mg daily. 4. Claritin 10 mg daily. 5. Ferrous fumarate with iron 28 mg daily.  6. Oxybutynin 5 mg every six hours as needed.  7. Detrol LA 4 mg once a day.  8. Prilosec 20 mg p.o. daily.  9. Flexeril 10 mg p.o. t.i.d. p.r.n. for muscle spasms.  10. Hydralazine 25 mg p.o. q.i.d.  11. Lantus 8 units at bedtime.  12. Zyvox, this has actually been changed to doxycycline as Zyvox is expensive and not covered by insurance and I called pharmacy to dispense doxycycline 100 days p.o. b.i.d. for 10 days.  13. Prednisone 20 mg daily for two days, 10 mg daily for two days then stop.  14. Metoprolol 25 mg p.o. b.i.d.    NOTE: Patient was on metformin before for diabetes but that is stopped because of her renal insufficiency. He also has history of B12 deficiency, anemia. Gets B12 shots every month. . Patient started on Lantus for diabetes.   DIET: Low sodium, ADA diet, puree with thickened liquids and nectar consistency. Medications in puree. Use assistance with feeding. Please see the speech therapy recommendation for full details.   CONSULTATIONS: 1. Vascular surgery consultation.  2. Podiatry consultation.   HOSPITAL COURSE: This is a 81 year old female with history of hypertension, diabetes mellitus type 2, chronic kidney disease and severe dementia nonverbal came in because of right leg contracture. Look at the history and physical by Dr. Delfino Lovett  for full detail. According to family the right leg contracture was relatively sudden onset with swelling around the right ankle and pain around the right knee. Patient followed by hospice at home. She is admitted for right leg contracture. She had lower extremity ultrasound of the right leg which did not show deep venous thrombosis. Seen by Dr. Wyn Quaker from vascular, recommended no further intervention as she has  pedal pulse, good capillary refill and no vascular insufficiency was there and he signed off on the case. Patient had  other work up  including hip x-ray, pelvic x-ray, ankle x-ray and knee x-ray of right leg. Patient's right foot x-ray showed osteopenia and  plantar calcaneal spur is noted. No lytic lesions. Incidental note of plantar calcaneal spur. Ultrasound of the right leg showed no blood clot. Lumbar spine x-ray showed severe compression deformity at T12, age indeterminate, likely chronic osteopenia. Patient has xray of ls spine without evidence of acute osseous abnormality. Right femur x-ray showed no acute abnormality. No dislocation or fracture or malalignment. Pelvic x-ray also showed no evidence of acute osseous abnormality within visualized portion of pelvis. Right leg contracture is most likely  due to her demenia which may be exacerbated possibly from urinary tract infection with the pain in that area causing the contracture. Patient has been started on Flexeril and there has been improvement in the contracture. Patient did have x-ray of the knee as well which showed only small amount of retropatellar effusion but no other issues. Patient to resume home hospice and home PT.  Urinary tract infection. Patient's urine culture showed leukocyte esterase 3+, nitrates negative and bacteria 1+. Urine culture showed Staphylococcus more than 100,000 colonies. She was on Unasyn initially and changed to Zyvox but discharged home on doxycycline for 10 days. Advised the son and hospice nurse to recheck the  urine culture and change Foley catheter every two weeks. Patient does have chronic indwelling Foley at home.   Acute on chronic renal insufficiency. Baseline creatinine is around 1.24 but GFR is low at 44. Patient's has creatinine of 1.71 and BUN 31 on 05/28. CT abdomen was done because of abdominal pain. It showed large amount of stool, fecal impaction, large hiatal hernia. She was started on stool softeners. Patient's BUN and creatinine on 06/02 showed BUN 36 and creatinine 1.32, almost back to baseline but I did stop metformin and Avapro and started her on Lantus for diabetes and sugars have been around 111. Patient is on Lantus. Her sugars were elevated because she was started on trial dose of prednisone for her leg pain. Her Lantus has been adjusted. She also got a prescription for Lantus.   Acute on chronic anemia. Baseline hemoglobin around 8 and she actually dropped to 6.8 during the hospital course. Stool occult test was negative. Patient's hemoglobin was 11.1 on 06/01 and on admission patient's hemoglobin was 9.6. The patient does have a history of B12 deficiency, anemia and iron deficiency  anemia. Patient did receive  prbc one< unit  transfusion and her hemoglobin is actually up to 11.1 and stayed stable,family refused further GI work up. Patient did not have any evidence of bleeding. Hemoglobin was 6.8 as I mentioned and she did receive 2 units of transfusion. Patient's B12 levels were also checked.called SOn next day of discharge with b12 results  Possible lung mass. Patient had x-ray of the chest on 05/31 for possible congestive heart failure. Patient has density in both lung bases concerning for atelectasis. Chest x-ray showed possible right lower lobe mass and  mild  pulmonary vascular congestion but no edema is identified so CT of the chest with contrast was ordered but because of her poor creatinine clearance and low GFR patient did not have CT chest with contrast, however, she had a chest  CT without contrast which showed large hiatal hernia, severe kyphosis and curvature of the spine and extremity contractures. Patient has , large hiatal hernia extending from midline to right and likely reflecting mass of concern which is unchanged from May 28. Patient has interstitial markings, increased density in the lung bases. Differential considerations are atelectasis. Patient has no hypoxia, has no fever and maybe small atelectasis.Hypertension. Patient's blood pressure was elevated to 190/78. Started her on Norvasc initially because of pedal edema. That is discontinued and gave  prescription for hydralazine and metoprolol.  . Blood pressure needs to be checked by hospice nurse. Patient needs repeat urine culture and also change the Foley in two weeks. 45 degrees after eating to help with hiatal hernia. Patient does have large hiatal hernia by CT chest but has no symptoms.   Dementia. Patient is followed by hospice at home.  Ingrown toenails. Patient has long ingrown toenails on the right leg. Podiatry, Dr. Alberteen Spindleline, has seen the patient and debrided all the nails.   Dementia and dysphagia. She is on dysphagia diet. Please refer to <speech therapy note Discharge diagnoses and medications, hospital course updated   to son and daughter at bedside.   TIME SPENT ON DISCHARGE PREPARATION: More than 30 minutes.  CODE STATUS: DO NOT RESUSCITATE.  ____________________________ Katha Hamming, MD sk:cms D: 02/19/2012 22:15:52 ET T: 02/20/2012 11:15:00 ET JOB#: [id number]  cc: Katha Hamming, MD, <Dictator> Katha Hamming MD ELECTRONICALLY SIGNED 02/26/2012 14:08

## 2015-01-10 NOTE — H&P (Signed)
PATIENT NAME:  Brenda Frey, Brenda Frey MR#:  [id number] DATE OF BIRTH:  July 11, 1931  DATE OF ADMISSION:  02/13/2012  ER REFERRING PHYSICIAN: Dorothea GlassmanPaul Malinda, MD  PRIMARY CARE PHYSICIAN: Silver HugueninAileen Miller[facility]  CHIEF COMPLAINT: Unable to move her right extremity.  HISTORY OF PRESENT ILLNESS: The patient is a 81 year old female with a known history of advanced dementia, hypertension, is followed by Hospice at home, was brought into the Emergency Department as she was unable to move her right lower extremity. It has been contracted since Saturday. She usually sleeps on that side. Her family member denies any fall or injury on that side. Hospice nurse evaluated the patient and felt that she might have had blood clot in her leg as her right ankle was cyanosed and very cold She was brought into the Emergency Department and was still having real difficulty moving her right extremity as it was very contracted. Her urinalysis showed possible urinary tract infection, and clinically she seemed to be dry. She is being admitted for further evaluation and management.   PAST MEDICAL HISTORY: The patient is nonverbal, and most of the history was obtained from the records, ED physician, and the family members.  1. Hypertension. 2. Diabetes.  3. Hyperlipidemia. 4. End-stage dementia, on Hospice care.   ALLERGIES: No known drug allergies.   REVIEW OF SYSTEMS: Review of systems is unobtainable secondary to severe end-stage dementia.   FAMILY HISTORY: Positive for hypertension.   SOCIAL HISTORY: She has end-stage dementia, followed by Hospice. She lives at home.  MEDICATIONS AT HOME: As per the medical record done by ER Nursing: 1. Actos 45 mg p.o. daily.  2. Aricept 10 mg p.o. at bedtime.  3. Aspirin 81 mg p.o. daily. 4. Avapro 300 mg p.o. daily. 5. Celebrex 200 mg p.o. daily as needed.  6. Citracal once daily.  7. Detrol LA 4 mg p.o. daily.  8. Glipizide 10 mg p.o. daily.  9. Metformin 1000 mg p.o. daily.   10. Multivitamin 1 tablet p.o. daily.  11. Namenda 10 mg p.o. b.i.d.  12. Omeprazole 20 mg p.o. daily.  13. Simvastatin 10 mg p.o. at bedtime. 14. Vitamin B12 once intramuscular monthly.  15. Zyrtec 10 mg p.o. daily.   PHYSICAL EXAMINATION:  VITAL SIGNS: Temperature 98.3, heart rate 60 per minute, respirations 20 per minute, blood pressure 150/67 mmHg. She is saturating 98% on room air.   GENERAL: The patient is a 81 year old female lying in the bed. She is nonverbal and lying comfortably without any acute distress.   HEENT: Eyes: Pupils are equal, round, reactive to light and accommodation. No scleral icterus. Extraocular muscles are intact. HENT: Head atraumatic, normocephalic. Oropharynx and nasopharynx are clear.   NECK: Supple. No jugular venous distention. No thyroid enlargement or tenderness.   LUNGS: Clear to auscultation bilaterally. No wheezing, rales, rhonchi, or crepitation.   CARDIOVASCULAR: S1, S2 normal. No murmurs, rubs, or gallop.   ABDOMEN: Soft, nontender, nondistended. Bowel sounds are present. No organomegaly or mass.   EXTREMITIES: She has a contracture on her right lower extremity. She is unable to extend it past 90 degrees. The left lower extremity is fine. She has an indwelling Foley catheter with bag.   NEUROLOGIC: She is nonverbal, makes eye contacts and has facial grimaces. One leg movement. Oral neurological examination was difficult.   PSYCHIATRIC: Unable to evaluate due to her end-stage dementia, nonverbal status.   LABORATORY, DIAGNOSTIC AND RADIOLOGICAL DATA:  Normal BMP except BUN of 31, creatinine 1.71, blood glucose 309.0.  Normal liver function tests.  CBC within normal limits except hemoglobin 9.3, hematocrit 28.4, platelets 501.0.  Normal coagulation panel.  Urinalysis showed 2004 WBCs, 3+ leukocyte esterase, WBC in clumps present.  Right foot x-ray showed no acute changes.   IMPRESSION AND PLAN:  1. Possible urinary tract infection based  on urinalysis: We will get urine culture and sensitivity. Start her on Rocephin empirically. This could be due to chronic indwelling Foley.  2. Possible embolism in the right lower extremity: Based on ED evaluation, although I doubt it based on my evaluation. We will hold off anticoagulation until Vascular Surgery evaluates the patient. We will obtain lower extremity Dopplers to rule out deep vein thrombosis. This is likely from chronic contracture in her lower extremity based on my clinical evaluation.  3. Right lower extremity contracture: We will obtain x-ray of the hip, femur and back to evaluate for any pathology there contributing to her contracture, start her on Flexeril at this time. Looking back at her previous admission in September 2012, she did have multiple contractures also, but family indicates that this is something new since Saturday. We will get further evaluation as above.  4. Acute on chronic kidney disease, stage II, with a baseline creatinine of 1.2: We will hydrate her and monitor her kidney function. This is likely prerenal.  5. Diabetes: We will hold her oral medications, start her on insulin sliding scale.   CODE STATUS: DO NOT RESUSCITATE. She is followed by Hospice at home. We will get Palliative Care to see her.   TIME TAKEN: Total time taking care of this patient is 55 minutes.   ____________________________ Ellamae Sia. Sherryll Burger, MD vss:cbb D: 02/13/2012 18:03:16 ET T: 02/14/2012 09:36:29 ET JOB#: [id number] cc: Yetta Flock, MD Annice Needy, MD Ellamae Sia [facility] MD ELECTRONICALLY SIGNED 02/15/2012 15:16

## 2015-01-10 NOTE — H&P (Signed)
PATIENT NAME:  Brenda Frey, Brenda Frey MR#:  [id number] DATE OF BIRTH:  1931-05-14  DATE OF ADMISSION:  02/29/2012  PRIMARY CARE PHYSICIAN: Dr. Francia Greaves  REFERRING PHYSICIAN: Dr. Carollee Massed  CHIEF COMPLAINT: Low blood sugar for the past two days and also facial swelling today.   HISTORY OF PRESENT ILLNESS: Patient is a 81 year old Caucasian female with history of dementia, leg contracture, staph urinary tract infection, hypertension, diabetes, chronic kidney disease presented to ED with above chief complaint. Patient is demented, nonverbal. According to her son patient is in hospice care at home. She was just discharged to home one week ago from the hospital for leg contracture and anemia. Patient has been on Lantus 8 units at bedtime, but developed low blood sugar at 30s for the past two days. In addition, he notice that the patient had facial swelling early in the morning especially on the left periorbital area but just now patient also had right periorbital swelling. In addition, patient has fever, 102. Patient had a urinary tract infection and follow up with physician was being treated with antibiotics as outpatient.   PAST MEDICAL HISTORY:  1. Hypertension. 2. Diabetes. 3. Hyperlipidemia. 4. End-stage dementia on hospice care.  5. Chronic kidney disease stage II. 6. Dementia.  7. Gastroesophageal reflux disease. 8. Hiatal hernia.  9. Anemia.  10. Staph urinary tract infection.  11. Leg contracture.   FAMILY HISTORY: Hypertension.   SOCIAL HISTORY: Lives at home. No smoking or drinking or illicit drugs. End-stage dementia in hospice care at home.   REVIEW OF SYSTEMS: Unable to obtain due to dementia status.   ALLERGIES: No.  HOME MEDICATIONS: 1. Cyclobenzaprine 10 mg p.o. every eight hours p.r.n.  2. Detrol LA 4 mg p.o. once daily. 3. Donepezil 10 mg p.o. daily.  4. Doxycycline 100 mg p.o. b.i.d.  5. Fluconazole 100 mg p.o. daily. 6. Hydralazine 25 mg p.o. q.i.d.  7. Lantus 8 units  sub-Q at bedtime.  8. Loratadine 10 mg p.o. daily. 9. Lopressor 25 mg p.o. b.i.d.  10. Omeprazole 20 mg p.o. b.i.d.  11. Zocor 10 mg p.o. at bedtime. 12. ES 1 tablet b.i.d.   PHYSICAL EXAMINATION:  VITAL SIGNS: Temperature 97.7, blood pressure 155/73, pulse 85, oxygen saturation 94% on room air, respirations 18.   GENERAL: Patient is demented in no acute distress.   HEENT: Patient has periorbital swelling, cannot open her eye but patient is demented. Unable to examine at this time. There is some erythema on the face skin.   NECK: Supple. No JVD or carotid bruit. No lymphadenopathy. No thyromegaly.   CARDIOVASCULAR: S1, S2 regular rate, rhythm. No murmurs, gallops.   PULMONARY: Bilateral air entry. No wheezing, rales.   ABDOMEN: Soft. No distention. Bowel sounds present. It is difficult to estimate whether patient has organomegaly due to patient's extremity contracture status.   EXTREMITIES: Bilateral leg contracted, mild edema. No clubbing or cyanosis.   SKIN: No rash or jaundice.   NEUROLOGY: Patient is highly demented and nonverbal, unable to examine at this time.   LABORATORY, DIAGNOSTIC AND RADIOLOGICAL DATA: Glucose 77. CAT scan of maxillofacial area without contrast showed bilateral periorbital cellulitis right greater than left. No evidence of postseptal extension of these findings. The air cell structures appear intact. Urinalysis showed WBC 56, RBC 2, nitrite negative. WBC 10.3, hemoglobin 11.7, platelets 382, glucose 57, BUN 26, creatinine 1.62. Electrolytes normal   IMPRESSION:  1. Periorbital cellulitis.  2. Hypoglycemia.  3. Diabetes, uncontrolled.  4. Urinary tract infection.  5. Acute  renal failure, dehydration and chronic kidney disease.  6. Anemia, stable.  7. Hypertension.  8. Dementia.  9. Leg contracture.   PLAN OF TREATMENT:  1. Patient will be admitted to medical floor. Will start vancomycin and Rocephin. Will get ID consult. Patient has a history of  MRSA. Will get contact isolation.   2. For hypoglycemia will hold the Lantus and start sliding scale and hypoglycemia protocol.  3. For mild acute renal failure and dehydration on chronic kidney disease we will start IV fluid and follow BMP.  4. Continue hypertension medication.  5. Patient is DO NOT RESUSCITATE status.   Discussed patient's situation and plan of treatment with patient's son.   TIME SPENT: About 60 minutes.   ____________________________ Shaune PollackQing Laelynn Blizzard, MD qc:cms D: 02/29/2012 19:16:09 ET T: 02/29/2012 23:23:06 ET  JOB#: [id number] cc: Shaune PollackQing Al Gagen, MD, <Dictator> Cheryl L. Lin GivensJeffries, MD Shaune PollackQING Vasil Juhasz MD ELECTRONICALLY SIGNED 03/03/2012 14:35

## 2015-01-10 NOTE — Consult Note (Signed)
PATIENT NAME:  Brenda Frey, Brenda Frey MR#:  [id number] DATE OF BIRTH:  1930-12-31  DATE OF CONSULTATION:  03/01/2012  REFERRING PHYSICIAN:  Dr. Imogene Burn  CONSULTING PHYSICIAN:  Rosalyn Gess. Vijay Durflinger, MD  REASON FOR CONSULTATION: Fever, rash, and face swelling.   HISTORY OF PRESENT ILLNESS: The patient is a 81 year old white female with a past history significant for diabetes, dementia, chronic Foley catheterization, and recent diagnosis of MRSA in the urine who presented with approximately two days of facial swelling and redness and a week or so of fever. The patient was recently admitted from May 28th through June 3rd. At that time her family states that she had swelling in her right leg and was not assisting them with transfers which is her usual behavior. They brought her to the hospital. Attempts were made to look for a clot but were somewhat limited by her ability to cooperate. At that time they did do a urine culture which grew MRSA. She was treated with oral antibiotics. No blood cultures were obtained during that hospitalization. She was eventually discharged on doxycycline. She was also given prednisone for several days as a vertebral fracture was also identified. Her family states her sugars were very high with the prednisone and she did begin having fevers in the first week that she was home. A repeat urine culture was obtained according to the family and presumably they found MRSA again because she was started on trimethoprim/sulfamethoxazole 2-1/2 days prior to admission. She was also given fluconazole presumably for some yeast infection in the groin. The morning after starting the trimethoprim/sulfamethoxazole she had hypoglycemia in the morning. This occurred the following morning as well. Her family also noted increased facial swelling and redness. The patient is unable to provide any history and the history is obtained exclusively from the chart. She was brought to the Emergency Room and was started on  vancomycin. She was also given a dose of ceftriaxone. The redness and swelling in the face have resolved. She has been afebrile in-house. Her white count on admission was 10.3 but went up to 14.0 today. Her ANC today is 12.6. Blood and urine cultures are currently pending from admission yesterday. She is currently only on the vancomycin.   ALLERGIES: None   PAST MEDICAL HISTORY:  1. Diabetes.  2. Dementia. The patient is on Hospice.  3. Hypertension.  4. Hypercholesterolemia.  5. Chronic renal insufficiency.  6. Gastroesophageal reflux disease.   7. Hiatal hernia.  8. Recent MRSA in the urine.   FAMILY HISTORY: Positive for hypertension.   SOCIAL HISTORY: The patient lives at home with her daughter and her family. She does not smoke. She does not drink. She is on Hospice care at home. They have a dog at home.   REVIEW OF SYSTEMS: Unable to obtain from the patient.   PHYSICAL EXAMINATION:   VITAL SIGNS: T-max 99.5, pulse 93, blood pressure 130/63, 95% on room air.   GENERAL: Thin 81 year old white female in no acute distress.   HEENT: Normocephalic, atraumatic. Pupils equal, reactive to light. Extraocular motion appeared to be intact. Sclerae, conjunctivae, and lids without evidence for emboli or petechiae. Oropharynx shows no erythema or exudate. Gums are in fair condition.   NECK: Supple. Full range of motion. Midline trachea. No lymphadenopathy. No thyromegaly.   CHEST: Clear to auscultation bilaterally. Good air movement. No focal consolidation.   CARDIAC: Regular rate and rhythm without murmur, rub, or gallop.   ABDOMEN: Soft, nontender, nondistended. No hepatosplenomegaly. No hernia is noted.  EXTREMITIES: No evidence for tenosynovitis. She has contractures in both hands as well as both legs. She appeared to have loss of muscle mass. She would follow somewhat simple commands.   SKIN: She had some minimal erythema of the face, a few areas specifically in the  suborbital/infraorbital area that appeared to be edematous and possibly hive-like reaction. There were no other rashes present. Other the ecchymosis, she had no obvious stigmata of endocarditis but it was difficult to look due to her contractures.   NEUROLOGIC: The patient was nonverbal. She would make eye contact.    MUSCULOSKELETAL: As described above.   PSYCHIATRIC: Difficult to assess.   LABORATORY DATA: BUN 24, creatinine 1.65, bicarbonate 24, anion gap 9, white count 14.0, hemoglobin 10.1, platelet count 337, ANC 12.6. White count on admission was 10.3. Urinalysis had greater than 500 protein, 2+ leukocyte esterase, negative nitrites, 2 red cells, and 56 white cells per high-power field. Urine culture shows no growth to date. Blood cultures show no growth to date.   Maxillofacial CT without contrast demonstrated soft tissue swelling present.   IMPRESSION: This is a 81 year old white female with a history of dementia, diabetes, chronic renal insufficiency, chronic Foley catheterization, and recent MRSA in the urine who was admitted with probable sulfa reaction.  RECOMMENDATIONS: 1. She started trimethoprim/sulfamethoxazole a few days prior to the facial swelling and erythema. Around this time she began having early morning hypoglycemia as well. These symptoms have improved since admission and stopping of the trimethoprim/sulfamethoxazole. Would consider her allergic to sulfa.  2. She was having fevers prior to starting the new antibiotic, however. She was admitted recently for leg swelling. At that time a urine culture grew MRSA. While Staph aureus can cause urinary tract infections in catheterized patients, it is also associated with hematogenous spread to the kidneys. No blood cultures were obtained during her last hospitalization. She could have endocarditis with Staph aureus. Will await the current blood cultures. Would not pursue echo unless her blood cultures are positive for MRSA.  3. On  the other hand, many patients with chronic Foleys have chronic catheterization. The presence of pyuria and positive culture in a demented patient may not indicate any infection at all. It may be that the Staph in the urine represents chronic colonization of her catheter.  4. Will continue the vancomycin for now.  5. Will follow her fever curve and her white count.  6. Would maintain contact isolation.     This is a highly complex Infectious Disease consult. Thank you very much for involving me in Ms. Holsinger's care.  ____________________________ Rosalyn GessMichael E. Taneah Masri, MD meb:drc D: 03/01/2012 15:48:34 ET T: 03/02/2012 09:08:04 ET JOB#: [id number]  cc: Rosalyn GessMichael E. Linzie Boursiquot, MD, <Dictator> Yareth Kearse E Frances Joynt MD ELECTRONICALLY SIGNED 03/15/2012 15:08

## 2015-01-10 NOTE — Consult Note (Signed)
PATIENT NAME:  Brenda Frey, Brenda Frey MR#:  [id number] DATE OF BIRTH:  1930-09-22  DATE OF CONSULTATION:  02/19/2012  CONSULTING PHYSICIAN:  Linus Galasodd Kadee Philyaw, DPM  REASON FOR CONSULTATION: The patient is a 81 year old female with advanced dementia with a recent history over the last week of her right lower leg becoming contracted. She has had some swelling and apparent pain in the foot although she is nonverbal. Also, she has some very long painful toenails.   PAST MEDICAL HISTORY:  1. Hypertension. 2. Diabetes. 3. Hyperlipidemia. 4. End-stage dementia, on Hospice care.   ALLERGIES: No known drug allergies.   REVIEW OF SYSTEMS: Review of systems is unobtainable.   FAMILY HISTORY: Hypertension.   SOCIAL HISTORY: She is currently followed by Hospice for end-stage dementia. She does live at home.   HOME MEDICATIONS:   1. Actos 45 mg p.o. daily.  2. Aricept 10 mg p.o. at bedtime.  3. Aspirin 81 mg p.o. daily.  4. Avapro 300 mg p.o. daily.  5. Celebrex 200 mg p.o. daily.  6. Citracal once daily.  7. Detrol LA 4 mg p.o. daily.  8. Glipizide 10 mg p.o. daily.  9. Metformin 1000 mg p.o. daily.  10. Multivitamin 1 tablet p.o. daily.  11. Namenda 10 mg p.o. b.i.d.  12. Omeprazole 20 mg p.o. daily.  13. Simvastatin 10 mg p.o. at bedtime.  14. Vitamin B12 once intramuscularly monthly.  15. Zyrtec 10 mg p.o. daily.   PHYSICAL EXAMINATION:  VASCULAR: DP and PT pulses are diminished. Capillary filling time appears intact.   NEUROLOGICAL: Unable to really assess. Appears to have some pain sensation.   INTEGUMENT: Skin is dry and atrophic with some bilateral edema. Some mild erythematous areas on the right foot, especially at the lateral fifth metatarsal head. No active ulcerations are noted. All of the toenails are thick, dystrophic and discolored, brittle with some subungual debris.   MUSCULOSKELETAL: Contracture of the right lower extremity. There is some pain on palpation in the forefoot area.    ASSESSMENT:  1. Pain and contracture, right foot.  2. Onychomycosis.   PLAN: Debridement of all of the nails. I do not really see any source for any infectious type process in the foot.   Thank you for this consultation.   ____________________________ Linus Galasodd Eulamae Greenstein, DPM tc:cbb D: 02/19/2012 13:18:32 ET T: 02/19/2012 13:37:41 ET JOB#: [id number]  cc: Linus Galasodd Lashay Osborne, DPM, <Dictator> Jamee Keach DPM ELECTRONICALLY SIGNED 03/01/2012 9:28

## 2015-07-02 ENCOUNTER — Telehealth: Payer: Self-pay
